# Patient Record
Sex: Male | Born: 1995 | Race: White | Hispanic: No | Marital: Single | Smoking: Current every day smoker
Health system: Southern US, Community
[De-identification: ages and names within clinical notes are randomized; demographics above are authoritative.]

---
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
## 2016-11-07 ENCOUNTER — Encounter (HOSPITAL_COMMUNITY): Payer: Self-pay | Admitting: Emergency Medicine

## 2016-11-07 ENCOUNTER — Emergency Department (HOSPITAL_COMMUNITY)
Admission: EM | Admit: 2016-11-07 | Discharge: 2016-11-07 | Disposition: A | Discharge status: Home Health | Payer: Self-pay | Attending: Emergency Medicine | Admitting: Emergency Medicine

## 2016-11-07 DIAGNOSIS — J039 Acute tonsillitis, unspecified: Secondary | ICD-10-CM | POA: Insufficient documentation

## 2016-11-07 DIAGNOSIS — F17210 Nicotine dependence, cigarettes, uncomplicated: Secondary | ICD-10-CM | POA: Insufficient documentation

## 2016-11-07 MED ORDER — AMOXICILLIN 500 MG PO CAPS: 500.0000 mg | ORAL_CAPSULE | Freq: Three times a day (TID) | ORAL | 0 refills | Status: DC

## 2016-11-07 NOTE — ED Triage Notes (Signed)
Pt c/o sore throat x 3 days

## 2016-11-07 NOTE — ED Provider Notes (Signed)
[facility]-EMERGENCY DEPT Provider Note   CSN: [id number] Arrival date & time: 11/07/16  [id number]     History   Chief Complaint Chief Complaint  Patient presents with  . Sore Throat    HPI Lee Lewis is a 21 y.o. male.  The history is provided by the patient.  Sore Throat  This is a new problem. The current episode started more than 2 days ago. The problem occurs constantly. The problem has been gradually worsening. Pertinent negatives include no headaches. Nothing aggravates the symptoms. Nothing relieves the symptoms. He has tried nothing for the symptoms.  Pt complains of swollen tonsils.    History reviewed. No pertinent past medical history.  There are no active problems to display for this patient.   History reviewed. No pertinent surgical history.     Home Medications    Prior to Admission medications   Medication Sig Start Date End Date Taking? Authorizing Provider  azithromycin (ZITHROMAX) 250 MG tablet Take all tablets at once. Take with food. 04/14/13   Acey LavWood, Allison L, MD    Family History No family history on file.  Social History Social History  Substance Use Topics  . Smoking status: Current Every Day Smoker    Types: Cigarettes  . Smokeless tobacco: Never Used  . Alcohol use Not on file     Allergies   Patient has no known allergies.   Review of Systems Review of Systems  Neurological: Negative for headaches.  All other systems reviewed and are negative.    Physical Exam Updated Vital Signs BP 131/76   Pulse 60   Temp 98.1 F (36.7 C)   Resp 18   Ht 5\' 5"  (1.651 m)   Wt 72.6 kg (160 lb)   SpO2 100%   BMI 26.63 kg/m   Physical Exam  Constitutional: He appears well-developed and well-nourished.  HENT:  Head: Normocephalic and atraumatic.  Right Ear: External ear normal.  Left Ear: External ear normal.  Nose: Nose normal.  Swollen bilat tonsils, erythema no exudate  Eyes: Conjunctivae are normal.  Neck: Neck supple.    Cardiovascular: Normal rate and regular rhythm.   No murmur heard. Pulmonary/Chest: Effort normal and breath sounds normal. No respiratory distress.  Abdominal: Soft. There is no tenderness.  Musculoskeletal: He exhibits no edema.  Neurological: He is alert.  Skin: Skin is warm and dry.  Psychiatric: He has a normal mood and affect.  Nursing note and vitals reviewed.    ED Treatments / Results  Labs (all labs ordered are listed, but only abnormal results are displayed) Labs Reviewed - No data to display  EKG  EKG Interpretation None       Radiology No results found.  Procedures Procedures (including critical care time)  Medications Ordered in ED Medications - No data to display   Initial Impression / Assessment and Plan / ED Course  I have reviewed the triage vital signs and the nursing notes.  Pertinent labs & imaging results that were available during my care of the patient were reviewed by me and considered in my medical decision making (see chart for details).     Meds ordered this encounter  Medications  . amoxicillin (AMOXIL) 500 MG capsule    Sig: Take 1 capsule (500 mg total) by mouth 3 (three) times daily.    Dispense:  30 capsule    Refill:  0    Order Specific Question:   Supervising Provider    Answer:   Eber HongMILLER, BRIAN [  [id number]]     Final Clinical Impressions(s) / ED Diagnoses   Final diagnoses:  Tonsillitis    New Prescriptions New Prescriptions   AMOXICILLIN (AMOXIL) 500 MG CAPSULE    Take 1 capsule (500 mg total) by mouth 3 (three) times daily.  An After Visit Summary was printed and given to the patient.    Elson Areas, [place] 11/07/16 0957    Lavera Guise, MD 11/07/16 [phone]

## 2016-11-07 NOTE — Discharge Instructions (Signed)
Return if any problems.

## 2017-02-02 ENCOUNTER — Emergency Department (HOSPITAL_COMMUNITY): Payer: Self-pay

## 2017-02-02 ENCOUNTER — Emergency Department (HOSPITAL_COMMUNITY)
Admission: EM | Admit: 2017-02-02 | Discharge: 2017-02-02 | Disposition: A | Discharge status: Home / Self Care | Payer: Self-pay | Attending: Emergency Medicine | Admitting: Emergency Medicine

## 2017-02-02 ENCOUNTER — Other Ambulatory Visit: Payer: Self-pay

## 2017-02-02 ENCOUNTER — Encounter (HOSPITAL_COMMUNITY): Payer: Self-pay | Admitting: Emergency Medicine

## 2017-02-02 DIAGNOSIS — R110 Nausea: Secondary | ICD-10-CM | POA: Insufficient documentation

## 2017-02-02 DIAGNOSIS — R10A2 Flank pain, left side: Secondary | ICD-10-CM

## 2017-02-02 DIAGNOSIS — F17210 Nicotine dependence, cigarettes, uncomplicated: Secondary | ICD-10-CM | POA: Insufficient documentation

## 2017-02-02 DIAGNOSIS — R109 Unspecified abdominal pain: Secondary | ICD-10-CM

## 2017-02-02 DIAGNOSIS — N3090 Cystitis, unspecified without hematuria: Secondary | ICD-10-CM

## 2017-02-02 DIAGNOSIS — R300 Dysuria: Secondary | ICD-10-CM | POA: Insufficient documentation

## 2017-02-02 LAB — BASIC METABOLIC PANEL
ANION GAP: 9 (ref 5–15)
BUN: 6 mg/dL (ref 6–20)
CALCIUM: 9.3 mg/dL (ref 8.9–10.3)
CO2: 27 mmol/L (ref 22–32)
Chloride: 100 mmol/L — ABNORMAL LOW (ref 101–111)
Creatinine, Ser: 1.19 mg/dL (ref 0.61–1.24)
Glucose, Bld: 89 mg/dL (ref 65–99)
Potassium: 3.6 mmol/L (ref 3.5–5.1)
Sodium: 136 mmol/L (ref 135–145)

## 2017-02-02 LAB — URINALYSIS, ROUTINE W REFLEX MICROSCOPIC
BACTERIA UA: NONE SEEN
BILIRUBIN URINE: NEGATIVE
Glucose, UA: NEGATIVE mg/dL
HGB URINE DIPSTICK: NEGATIVE
Ketones, ur: NEGATIVE mg/dL
NITRITE: NEGATIVE
PROTEIN: NEGATIVE mg/dL
Specific Gravity, Urine: 1.008 (ref 1.005–1.030)
pH: 6 (ref 5.0–8.0)

## 2017-02-02 LAB — CBC
HCT: 48.2 % (ref 39.0–52.0)
Hemoglobin: 15.9 g/dL (ref 13.0–17.0)
MCH: 30.9 pg (ref 26.0–34.0)
MCHC: 33 g/dL (ref 30.0–36.0)
MCV: 93.8 fL (ref 78.0–100.0)
PLATELETS: 204 10*3/uL (ref 150–400)
RBC: 5.14 MIL/uL (ref 4.22–5.81)
RDW: 12.4 % (ref 11.5–15.5)
WBC: 6.2 10*3/uL (ref 4.0–10.5)

## 2017-02-02 MED ORDER — CIPROFLOXACIN HCL 500 MG PO TABS: 500.0000 mg | ORAL_TABLET | Freq: Two times a day (BID) | ORAL | 0 refills | Status: DC

## 2017-02-02 MED ORDER — HYDROCODONE-ACETAMINOPHEN 5-325 MG PO TABS
1.0000 | ORAL_TABLET | Freq: Once | ORAL | Status: AC
  Administered 2017-02-02: 1 via ORAL
  Filled 2017-02-02: qty 1

## 2017-02-02 MED ORDER — ONDANSETRON 4 MG PO TBDP
4.0000 mg | ORAL_TABLET | Freq: Once | ORAL | Status: AC
  Administered 2017-02-02: 4 mg via ORAL
  Filled 2017-02-02: qty 1

## 2017-02-02 MED ORDER — NAPROXEN 500 MG PO TABS
500.0000 mg | ORAL_TABLET | Freq: Two times a day (BID) | ORAL | 0 refills | Status: DC
Start: 2017-02-02 — End: 2022-09-08

## 2017-02-02 NOTE — ED Provider Notes (Signed)
[facility]ENCY [facility] Provider Note   CSN: [id number] Arrival date & time: 02/02/17  [id number]     History   Chief Complaint Chief Complaint  Patient presents with  . Flank Pain    HPI Lee Lewis is a 21 y.o. male.  Patient with a complaint of left CVA and left flank pain associated with painful urination.  Also associated with nausea ongoing for 3 days.  Patient without past history of kidney stones.  Denies any blood in the urine or any discharge.  No fevers.      History reviewed. No pertinent past medical history.  There are no active problems to display for this patient.   History reviewed. No pertinent surgical history.     Home Medications    Prior to Admission medications   Medication Sig Start Date End Date Taking? Authorizing Provider  amoxicillin (AMOXIL) 500 MG capsule Take 1 capsule (500 mg total) by mouth 3 (three) times daily. 11/07/16   Elson AreasSofia, Leslie K, PA-C  azithromycin (ZITHROMAX) 250 MG tablet Take all tablets at once. Take with food. 04/14/13   Acey LavWood, Allison L, MD  ciprofloxacin (CIPRO) 500 MG tablet Take 1 tablet (500 mg total) by mouth 2 (two) times daily. 02/02/17   Vanetta MuldersZackowski, Naftali Carchi Fix, MD  naproxen (NAPROSYN) 500 MG tablet Take 1 tablet (500 mg total) by mouth 2 (two) times daily. 02/02/17   Vanetta MuldersZackowski, Melanee Cordial, MD    Family History History reviewed. No pertinent family history.  Social History Social History   Tobacco Use  . Smoking status: Current Every Day Smoker    Packs/day: 0.50    Types: Cigarettes  . Smokeless tobacco: Never Used  Substance Use Topics  . Alcohol use: Yes    Alcohol/week: 7.2 oz    Types: 12 Cans of beer per week    Comment: everyother day  . Drug use: Yes    Types: Marijuana    Comment: 2x monthly     Allergies   Patient has no known allergies.   Review of Systems Review of Systems  Constitutional: Negative for fever.  HENT: Negative for congestion.   Eyes: Negative for visual disturbance.    Respiratory: Negative for shortness of breath.   Cardiovascular: Negative for chest pain.  Gastrointestinal: Negative for abdominal pain.  Genitourinary: Positive for dysuria and flank pain. Negative for hematuria.  Musculoskeletal: Positive for back pain.  Skin: Negative for rash.  Neurological: Negative for headaches.  Hematological: Does not bruise/bleed easily.  Psychiatric/Behavioral: Negative for confusion.     Physical Exam Updated Vital Signs BP (!) 133/96   Pulse 76   Temp 98.2 F (36.8 C)   Resp 16   Ht 1.651 m (5\' 5" )   Wt 68 kg (150 lb)   SpO2 99%   BMI 24.96 kg/m   Physical Exam  Constitutional: He is oriented to person, place, and time. He appears well-developed and well-nourished. No distress.  HENT:  Head: Normocephalic and atraumatic.  Mouth/Throat: Oropharynx is clear and moist.  Eyes: Conjunctivae and EOM are normal.  Neck: Normal range of motion. Neck supple.  Cardiovascular: Normal rate, regular rhythm and normal heart sounds.  Pulmonary/Chest: Effort normal and breath sounds normal. No respiratory distress.  Abdominal: Soft. Bowel sounds are normal. There is no tenderness.  Musculoskeletal: Normal range of motion. He exhibits no edema or tenderness.  Neurological: He is alert and oriented to person, place, and time. No cranial nerve deficit or sensory deficit. He exhibits normal muscle tone. Coordination  normal.  Skin: No erythema.  Nursing note and vitals reviewed.    ED Treatments / Results  Labs (all labs ordered are listed, but only abnormal results are displayed) Labs Reviewed  URINALYSIS, ROUTINE W REFLEX MICROSCOPIC - Abnormal; Notable for the following components:      Result Value   Leukocytes, UA MODERATE (*)    Squamous Epithelial / LPF 0-5 (*)    All other components within normal limits  BASIC METABOLIC PANEL - Abnormal; Notable for the following components:   Chloride 100 (*)    All other components within normal limits   URINE CULTURE  CBC    EKG  EKG Interpretation None       Radiology Ct Renal Stone Study  Result Date: 02/02/2017 CLINICAL DATA:  Left flank pain and nausea for the past 3 days. EXAM: CT ABDOMEN AND PELVIS WITHOUT CONTRAST TECHNIQUE: Multidetector CT imaging of the abdomen and pelvis was performed following the standard protocol without IV contrast. COMPARISON:  None. FINDINGS: Lower chest: No acute abnormality. Hepatobiliary: No focal liver abnormality is seen. Focal fat along the falciform ligament. No gallstones, gallbladder wall thickening, or biliary dilatation. Pancreas: Unremarkable. No pancreatic ductal dilatation or surrounding inflammatory changes. Spleen: Normal in size without focal abnormality. Adrenals/Urinary Tract: The adrenal glands are unremarkable. The right kidney is located in the pelvis. No renal calculi or hydronephrosis. Mild circumferential bladder wall thickening. Stomach/Bowel: Stomach is within normal limits. Appendix appears normal. No evidence of bowel wall thickening, distention, or inflammatory changes. Vascular/Lymphatic: No significant vascular findings are present. No enlarged abdominal or pelvic lymph nodes. Reproductive: Prostate is unremarkable. Other: Small fat containing umbilical hernia. No free fluid or pneumoperitoneum. Musculoskeletal: No acute or significant osseous findings. IMPRESSION: 1. No renal or ureteral calculi.  No hydronephrosis. 2. 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 3. Mild circumferential bladder wall thickening, as can be seen with cystitis. Correlate with urinalysis. Electronically Signed   By: Obie Dredge M.D.   On: 02/02/2017 09:05    Procedures Procedures (including critical care time)  Medications Ordered in ED Medications  ondansetron (ZOFRAN-ODT) disintegrating tablet 4 mg (4 mg Oral Given 02/02/17 0810)  HYDROcodone-acetaminophen (NORCO/VICODIN) 5-325 MG per tablet 1 tablet (1 tablet Oral Given 02/02/17 0810)      Initial Impression / Assessment and Plan / ED Course  I have reviewed the triage vital signs and the nursing notes.  Pertinent labs & imaging results that were available during my care of the patient were reviewed by me and considered in my medical decision making (see chart for details).    Workup without evidence of any kidney stones.  There is some thickening of the bladder wall suggestive of perhaps cystitis.  Urinalysis not confirmatory for this.  No hematuria.  Urine culture pending.  But due to the inflammation and patient's symptoms and no evidence of stones will treat with Cipro for the next 7 days.  And treat also as if it could be musculoskeletal left flank pain with Naprosyn.  Patient will return for any new or worse symptoms.  Patient nontoxic no acute abdominal process either on exam or by CT.   Final Clinical Impressions(s) / ED Diagnoses   Final diagnoses:  Left flank pain  Cystitis    ED Discharge Orders        Ordered    naproxen (NAPROSYN) 500 MG tablet  2 times daily     02/02/17 0945    ciprofloxacin (CIPRO) 500 MG tablet  2 times  daily     02/02/17 0945       Vanetta MuldersZackowski, Almina Schul, MD 02/02/17 [phone]

## 2017-02-02 NOTE — ED Triage Notes (Signed)
PT c/o left flank pain with nausea x3 days with painful urination. PT also c/o generalized malaise.

## 2017-02-02 NOTE — ED Notes (Signed)
Pt aware of need for urine sample.  

## 2017-02-02 NOTE — Discharge Instructions (Signed)
Take antibiotic as directed.  Take the Naprosyn to help with pain.  Return for any new or worse symptoms.  Urine culture is pending.  If related to a bladder infection would expect improvement in 2 days.

## 2017-02-03 LAB — URINE CULTURE: Culture: NO GROWTH

## 2018-07-22 IMAGING — CT CT RENAL STONE PROTOCOL
2 of 4 series · 16 of 46 positions shown, 18 images · non-contrast
Comparison: None.

CLINICAL DATA: Left flank pain and nausea for the past 3 days.

EXAM:
CT ABDOMEN AND PELVIS WITHOUT CONTRAST
TECHNIQUE: Multidetector CT imaging of the abdomen and pelvis was performed
following the standard protocol without IV contrast.

[Series 2: axial st · axial · 0.63mm/px · z∈[-381,+19]mm · 13 of 88 slices shown, 15 images]
[im 4/88  soft-tissue]
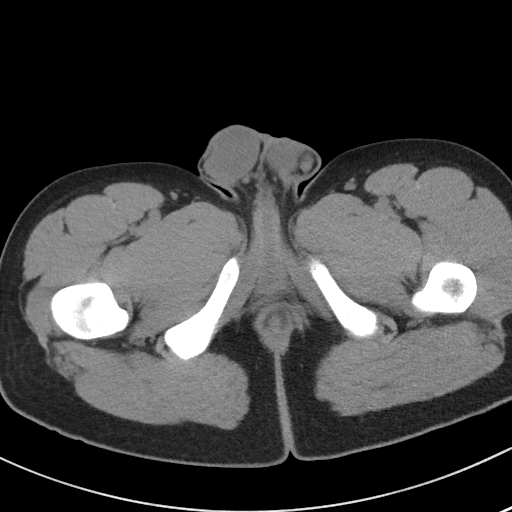
[im 4/88  bone]
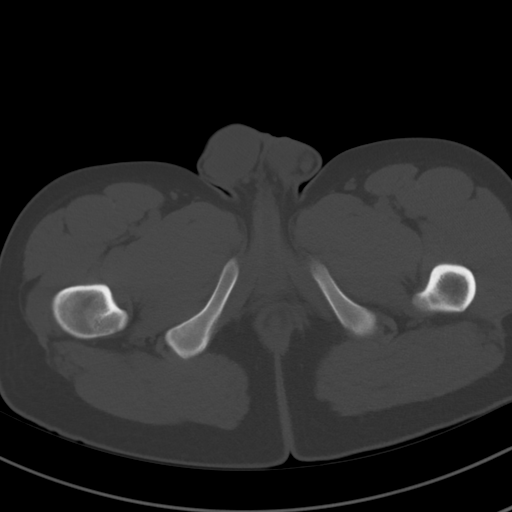
[im 11/88  soft-tissue]
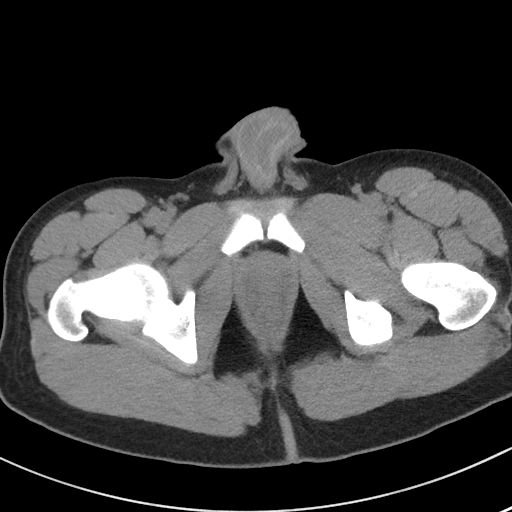
[im 18/88  soft-tissue]
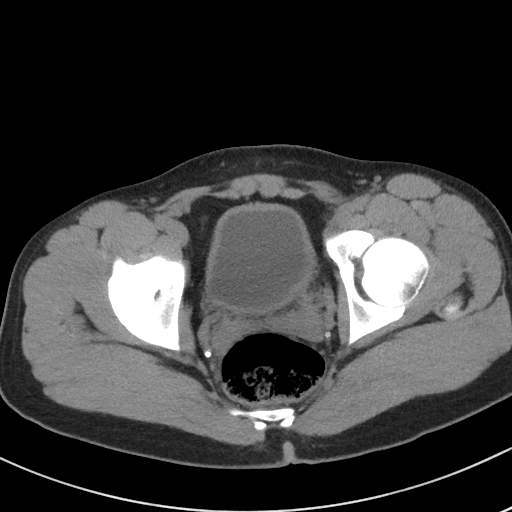
[im 25/88  soft-tissue]
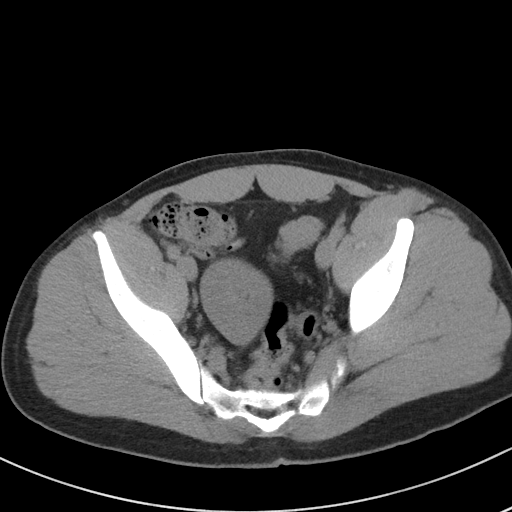
[im 32/88  soft-tissue]
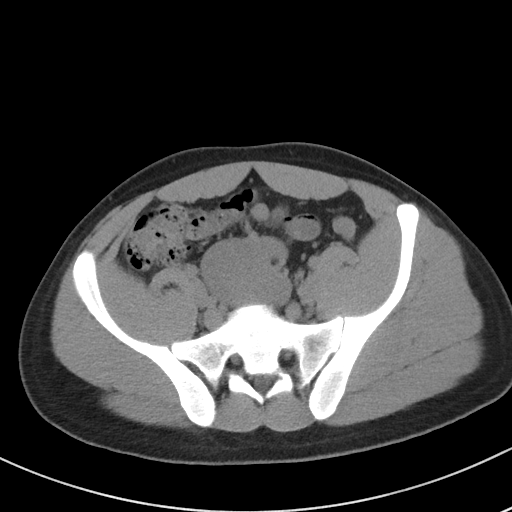
[im 39/88  soft-tissue]
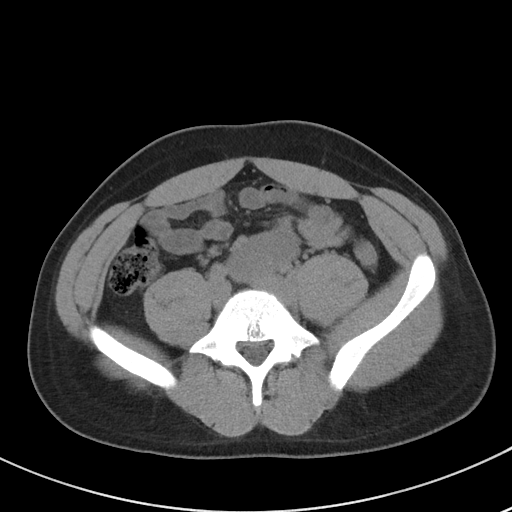
[im 46/88  soft-tissue]
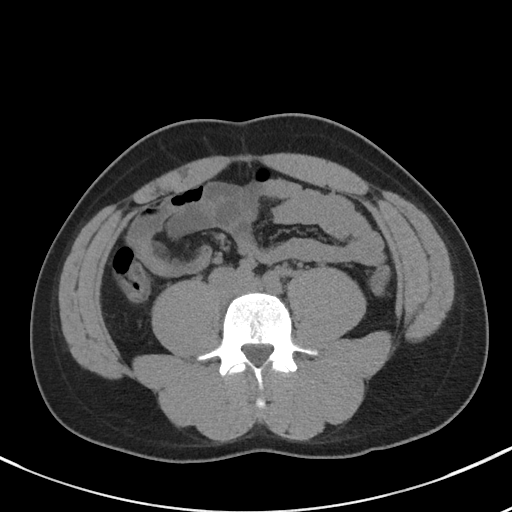
[im 49/88  soft-tissue]
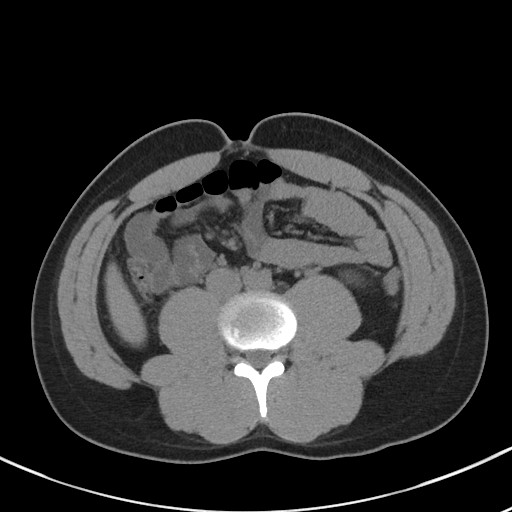
[im 56/88  soft-tissue]
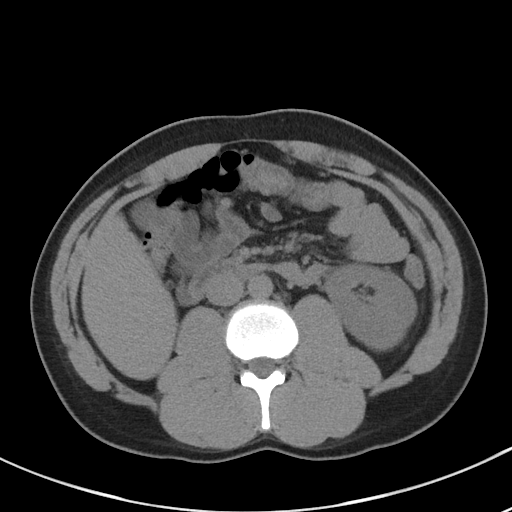
[im 56/88  bone]
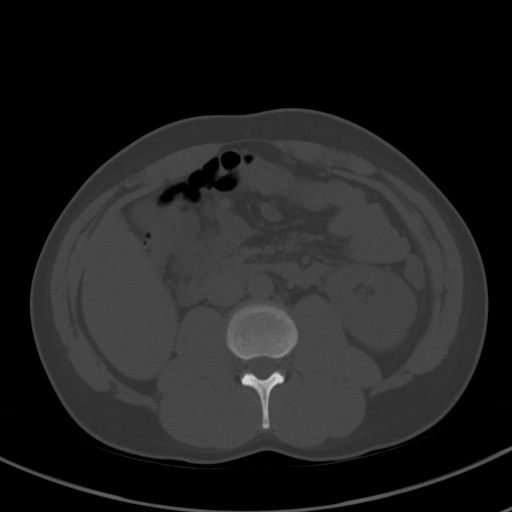
[im 63/88  soft-tissue]
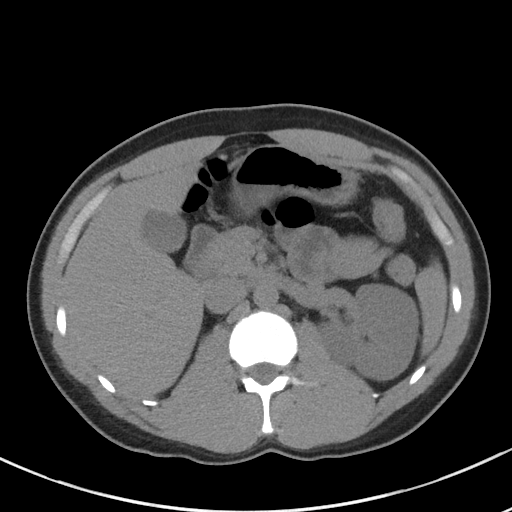
[im 70/88  soft-tissue]
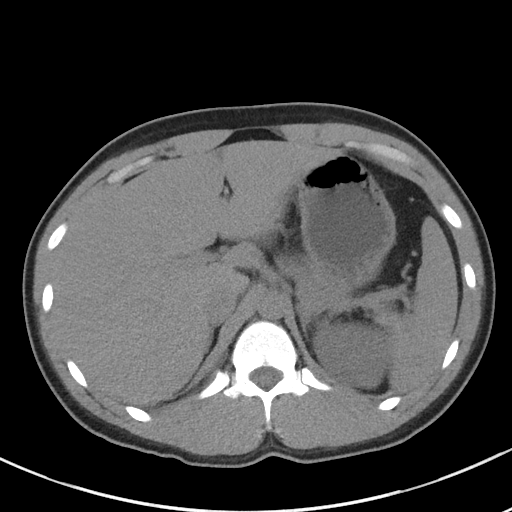
[im 77/88  soft-tissue]
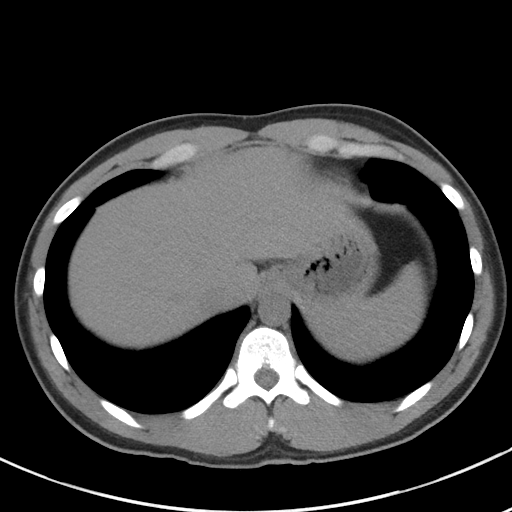
[im 84/88  soft-tissue]
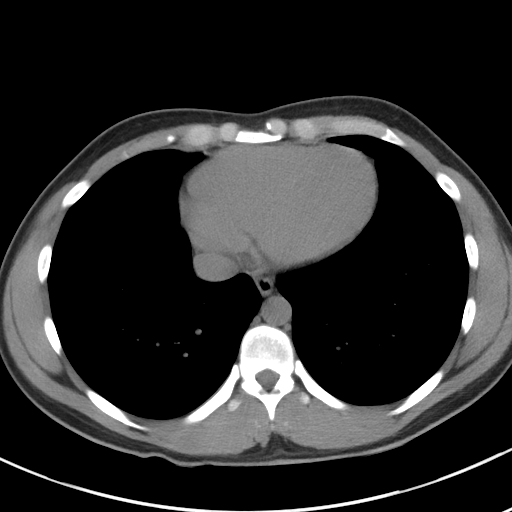

[Series 5: coronal st · coronal · 0.70mm/px · 3 of 85 slices shown]
[im 29/85  soft-tissue]
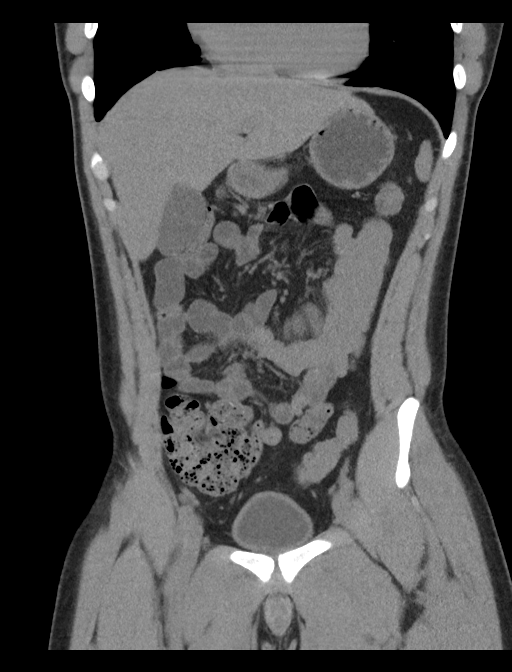
[im 38/85  soft-tissue]
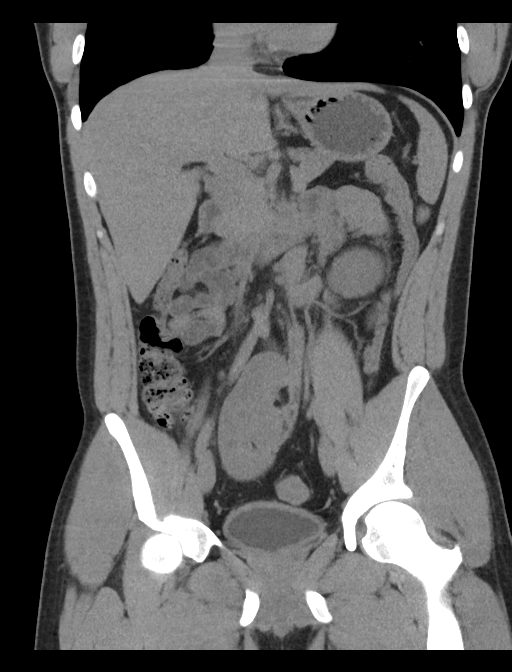
[im 47/85  soft-tissue]
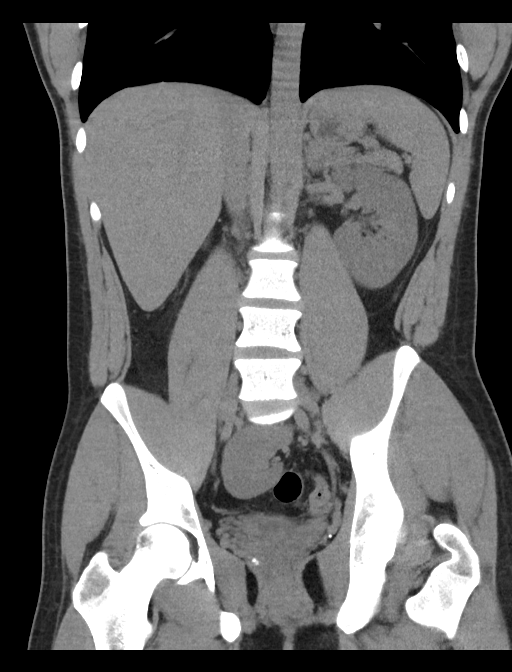

[16 of 46 positions shown; findings below may reference images not displayed]

FINDINGS: Lower chest: No acute abnormality.

Hepatobiliary: No focal liver abnormality is seen. Focal fat along
the falciform ligament. No gallstones, gallbladder wall thickening,
or biliary dilatation.

Pancreas: Unremarkable. No pancreatic ductal dilatation or
surrounding inflammatory changes.

Spleen: Normal in size without focal abnormality.

Adrenals/Urinary Tract: The adrenal glands are unremarkable. The
right kidney is located in the pelvis. No renal calculi or
hydronephrosis. Mild circumferential bladder wall thickening.

Stomach/Bowel: Stomach is within normal limits. Appendix appears
normal. No evidence of bowel wall thickening, distention, or
inflammatory changes.

Vascular/Lymphatic: No significant vascular findings are present. No
enlarged abdominal or pelvic lymph nodes.

Reproductive: Prostate is unremarkable.

Other: Small fat containing umbilical hernia. No free fluid or
pneumoperitoneum.

Musculoskeletal: No acute or significant osseous findings.
IMPRESSION: 1. No renal or ureteral calculi.  No hydronephrosis.
2. Ectopic right kidney located in the pelvis.
3. Mild circumferential bladder wall thickening, as can be seen with
cystitis. Correlate with urinalysis.

## 2022-04-21 ENCOUNTER — Emergency Department (HOSPITAL_BASED_OUTPATIENT_CLINIC_OR_DEPARTMENT_OTHER)
Admission: EM | Admit: 2022-04-21 | Discharge: 2022-04-21 | Disposition: A | Discharge status: Home / Self Care | Payer: Self-pay | Attending: Emergency Medicine | Admitting: Emergency Medicine

## 2022-04-21 ENCOUNTER — Emergency Department (HOSPITAL_BASED_OUTPATIENT_CLINIC_OR_DEPARTMENT_OTHER): Payer: Self-pay

## 2022-04-21 ENCOUNTER — Encounter (HOSPITAL_BASED_OUTPATIENT_CLINIC_OR_DEPARTMENT_OTHER): Payer: Self-pay

## 2022-04-21 ENCOUNTER — Telehealth (HOSPITAL_BASED_OUTPATIENT_CLINIC_OR_DEPARTMENT_OTHER): Payer: Self-pay | Admitting: Emergency Medicine

## 2022-04-21 DIAGNOSIS — N3001 Acute cystitis with hematuria: Secondary | ICD-10-CM

## 2022-04-21 DIAGNOSIS — F17210 Nicotine dependence, cigarettes, uncomplicated: Secondary | ICD-10-CM | POA: Insufficient documentation

## 2022-04-21 DIAGNOSIS — N50812 Left testicular pain: Secondary | ICD-10-CM | POA: Insufficient documentation

## 2022-04-21 DIAGNOSIS — N3000 Acute cystitis without hematuria: Secondary | ICD-10-CM | POA: Insufficient documentation

## 2022-04-21 LAB — URINALYSIS, ROUTINE W REFLEX MICROSCOPIC
Glucose, UA: NEGATIVE mg/dL
Ketones, ur: 40 mg/dL — AB
Nitrite: NEGATIVE
Protein, ur: 100 mg/dL — AB
Specific Gravity, Urine: 1.02 (ref 1.005–1.030)
pH: 6 (ref 5.0–8.0)

## 2022-04-21 LAB — URINALYSIS, MICROSCOPIC (REFLEX)

## 2022-04-21 MED ORDER — CEFDINIR 300 MG PO CAPS: 300.0000 mg | ORAL_CAPSULE | Freq: Two times a day (BID) | ORAL | 0 refills | Status: AC

## 2022-04-21 NOTE — ED Notes (Signed)
Notified lab of add on GC Chlamydia

## 2022-04-21 NOTE — ED Notes (Signed)
Urine specimen in lab

## 2022-04-21 NOTE — ED Notes (Signed)
D/c paperwork reviewed with pt, including prescriptions and follow up care.  No questions or concerns voiced at time of d/c. Lee Lewis Pt verbalized understanding, Ambulatory without assistance to ED exit, NAD.

## 2022-04-21 NOTE — ED Triage Notes (Signed)
C/o dull pains in left testicle since yesterday, starting having hematuria yesterday.

## 2022-04-21 NOTE — Discharge Instructions (Addendum)
We evaluated you for your testicular pain.  Your ultrasound did not show any dangerous problem.  Your urinary testing showed signs of a urinary infection.  We have also sent testing for gonorrhea and chlamydia.  Please keep an eye on your MyChart profile.  If this comes back positive, we will hopefully also call you.  You will need to come back to the emergency department or go to urgent care for treatment.  Please return to the emergency department if you develop any fevers or chills, flank pain, worsening symptoms, nausea or vomiting, or any other concerning symptoms.

## 2022-04-21 NOTE — ED Notes (Signed)
Patient transported to Ultrasound 

## 2022-04-22 LAB — GC/CHLAMYDIA PROBE AMP (~~LOC~~) NOT AT ARMC
Chlamydia: NEGATIVE
Comment: NEGATIVE
Comment: NORMAL
Neisseria Gonorrhea: NEGATIVE

## 2022-04-22 LAB — URINE CULTURE

## 2022-04-22 NOTE — ED Provider Notes (Signed)
EMERGENCY DEPARTMENT AT [facility] Provider Note  CSN: [id number] Arrival date & time: 04/21/22 1056  Chief Complaint(s) Hematuria and Testicle Pain  HPI Lee Lewis is a 27 y.o. male without significant past medical history presenting to the emergency department with left testicle pain, hematuria.  Reports symptoms have been present since yesterday.  He denies dysuria or urethral discharge.  He reports that he had this once before in his left testicle swelled up and it went away on its own. No fevers or chills.  Reports he has been monogamous with his fianc with no new sexual partners.  No back or flank pain.  No abdominal pain.  Symptoms mild.  No trauma to the testicle    Past Medical History History reviewed. No pertinent past medical history. There are no problems to display for this patient.  Home Medication(s) Prior to Admission medications   Medication Sig Start Date End Date Taking? Authorizing Provider  cefdinir (OMNICEF) 300 MG capsule Take 1 capsule (300 mg total) by mouth 2 (two) times daily for 7 days. 04/21/22 04/28/22 Yes Cristie Hem, MD  naproxen (NAPROSYN) 500 MG tablet Take 1 tablet (500 mg total) by mouth 2 (two) times daily. 02/02/17   Fredia Sorrow, MD                                                                                                                                    Past Surgical History History reviewed. No pertinent surgical history. Family History History reviewed. No pertinent family history.  Social History Social History   Tobacco Use   Smoking status: Every Day    Packs/day: 0.50    Types: Cigarettes   Smokeless tobacco: Never  Vaping Use   Vaping Use: Some days  Substance Use Topics   Alcohol use: Not Currently    Alcohol/week: 12.0 standard drinks of alcohol    Types: 12 Cans of beer per week   Drug use: Yes    Types: Marijuana    Comment: 2x monthly   Allergies Patient has no known  allergies.  Review of Systems Review of Systems  All other systems reviewed and are negative.   Physical Exam Vital Signs  I have reviewed the triage vital signs BP 120/64   Pulse 98   Temp 98.3 F (36.8 C)   Resp 15   Ht 5' 5"$  (1.651 m)   Wt 71.7 kg   SpO2 99%   BMI 26.29 kg/m  Physical Exam Vitals and nursing note reviewed.  Constitutional:      General: He is not in acute distress.    Appearance: Normal appearance.  HENT:     Mouth/Throat:     Mouth: Mucous membranes are moist.  Eyes:     Conjunctiva/sclera: Conjunctivae normal.  Cardiovascular:     Rate and Rhythm: Normal rate and regular rhythm.  Pulmonary:     Effort: Pulmonary  effort is normal. No respiratory distress.     Breath sounds: Normal breath sounds.  Abdominal:     General: Abdomen is flat.     Palpations: Abdomen is soft.     Tenderness: There is no abdominal tenderness.  Genitourinary:    Comments: Chaperoned by RN, normal external male GU exam, mild left testicle ttp Musculoskeletal:     Right lower leg: No edema.     Left lower leg: No edema.  Skin:    General: Skin is warm and dry.     Capillary Refill: Capillary refill takes less than 2 seconds.  Neurological:     Mental Status: He is alert and oriented to person, place, and time. Mental status is at baseline.  Psychiatric:        Mood and Affect: Mood normal.        Behavior: Behavior normal.     ED Results and Treatments Labs (all labs ordered are listed, but only abnormal results are displayed) Labs Reviewed  URINALYSIS, ROUTINE W REFLEX MICROSCOPIC - Abnormal; Notable for the following components:      Result Value   APPearance HAZY (*)    Hgb urine dipstick MODERATE (*)    Bilirubin Urine SMALL (*)    Ketones, ur 40 (*)    Protein, ur 100 (*)    Leukocytes,Ua SMALL (*)    All other components within normal limits  URINALYSIS, MICROSCOPIC (REFLEX) - Abnormal; Notable for the following components:   Bacteria, UA FEW (*)     Non Squamous Epithelial PRESENT (*)    All other components within normal limits  URINE CULTURE  GC/CHLAMYDIA PROBE AMP ([place]) NOT AT [facility]                                                                                                                          Radiology US SCROTUM W/DOPPLER  Result Date: 04/21/2022 CLINICAL DATA:  Testicular pain on the left EXAM: SCROTAL ULTRASOUND DOPPLER ULTRASOUND OF THE TESTICLES TECHNIQUE: Complete ultrasound examination of the testicles, epididymis, and other scrotal structures was performed. Color and spectral Doppler ultrasound were also utilized to evaluate blood flow to the testicles. COMPARISON:  None Available. FINDINGS: Right testicle Measurements: 5.1 x 2.0 x 2.9. No mass or microlithiasis visualized. Left testicle Measurements: 4.6 x 1.9 x 2.9. No mass or microlithiasis visualized. Right epididymis:  Normal in size and appearance. Left epididymis:  Normal in size and appearance. Hydrocele:  None visualized. Varicocele:  None visualized. Pulsed Doppler interrogation of both testes demonstrates normal low resistance arterial and venous waveforms bilaterally. IMPRESSION: Unremarkable testicular ultrasound. Preserved echotexture and blood flow on Doppler Electronically Signed   By: Jill Side M.D.   On: 04/21/2022 12:54    Pertinent labs & imaging results that were available during my care of the patient were reviewed by me and considered in my medical decision making (see MDM for details).  Medications Ordered in ED Medications - No data to  display                                                                                                                                   Procedures Procedures  (including critical care time)  Medical Decision Making / ED Course   MDM:  27 year old male presenting to the emergency department with left testicle pain and hematuria.  Patient well-appearing, testicular exam with minimal tenderness,  no apparent abnormality.  Ultrasound of the scrotum performed without evidence of abscess, epididymitis, torsion.  Urinalysis with some WBCs and bacteria, few squamous cells, will treat for possible UTI, discussed possibility of gonorrhea and chlamydia with the patient, he reports that he has been monogamous and has had no new sexual partners in some time, sent gonorrhea chlamydia testing, advised patient that he may need to return if this comes back positive.  Patient understands and knows that if he is positive he needs to let his fiance know as well.      Additional history obtained: -Additional history obtained from spouse -External records from outside source obtained and reviewed including: Chart review including previous notes, labs, imaging, consultation notes including ED visit 02/02/22   Lab Tests: -I ordered, reviewed, and interpreted labs.   The pertinent results include:   Labs Reviewed  URINALYSIS, ROUTINE W REFLEX MICROSCOPIC - Abnormal; Notable for the following components:      Result Value   APPearance HAZY (*)    Hgb urine dipstick MODERATE (*)    Bilirubin Urine SMALL (*)    Ketones, ur 40 (*)    Protein, ur 100 (*)    Leukocytes,Ua SMALL (*)    All other components within normal limits  URINALYSIS, MICROSCOPIC (REFLEX) - Abnormal; Notable for the following components:   Bacteria, UA FEW (*)    Non Squamous Epithelial PRESENT (*)    All other components within normal limits  URINE CULTURE  GC/CHLAMYDIA PROBE AMP ([place]) NOT AT [facility]    Notable for bacturia, mild WBC in urine  Imaging Studies ordered: I ordered imaging studies including US scrotum On my interpretation imaging demonstrates no acute process I independently visualized and interpreted imaging. I agree with the radiologist interpretation   Medicines ordered and prescription drug management: Meds ordered this encounter  Medications   cefdinir (OMNICEF) 300 MG capsule    Sig: Take 1  capsule (300 mg total) by mouth 2 (two) times daily for 7 days.    Dispense:  14 capsule    Refill:  0    -I have reviewed the patients home medicines and have made adjustments as needed  Social Determinants of Health:  Diagnosis or treatment significantly limited by social determinants of health: current smoker   Co morbidities that complicate the patient evaluation History reviewed. No pertinent past medical history.    Dispostion: Disposition decision including need for hospitalization was considered, and patient discharged from emergency department.    Final  Clinical Impression(s) / ED Diagnoses Final diagnoses:  Acute cystitis with hematuria     This chart was dictated using voice recognition software.  Despite best efforts to proofread,  errors can occur which can change the documentation meaning.    Cristie Hem, MD 04/22/22 1000

## 2022-04-23 LAB — URINE CULTURE: Culture: 40000 — AB

## 2022-04-24 ENCOUNTER — Telehealth (HOSPITAL_BASED_OUTPATIENT_CLINIC_OR_DEPARTMENT_OTHER): Payer: Self-pay | Admitting: *Deleted

## 2022-04-24 NOTE — Telephone Encounter (Signed)
Post ED Visit - Positive Culture Follow-up  Culture report reviewed by antimicrobial stewardship pharmacist: [place] Team []$  Elenor Quinones, Pharm.D. []$  Heide Guile, Pharm.D., BCPS AQ-ID []$  Parks Neptune, Pharm.D., BCPS []$  Alycia Rossetti, Pharm.D., BCPS []$  [place], Pharm.D., BCPS, AAHIVP []$  Legrand Como, Pharm.D., BCPS, AAHIVP []$  Salome Arnt, PharmD, BCPS []$  Johnnette Gourd, PharmD, BCPS []$  Hughes Better, PharmD, BCPS []$  Leeroy Cha, PharmD []$  Laqueta Linden, PharmD, BCPS []$  Albertina Parr, PharmD  [place] Team []$  Leodis Sias, PharmD []$  Lindell Spar, PharmD []$  Royetta Asal, PharmD []$  Graylin Shiver, Rph []$  Rema Fendt) Glennon Mac, PharmD []$  Arlyn Dunning, PharmD []$  Netta Cedars, PharmD []$  Dia Sitter, PharmD []$  Leone Haven, PharmD []$  Gretta Arab, PharmD []$  Theodis Shove, PharmD []$  Peggyann Juba, PharmD []$  Reuel Boom, PharmD   Positive urine culture Treated with Cefdinir, organism sensitive to the same and no further patient follow-up is required at this time.  Arturo Morton, PharmD Harlon Flor Talley 04/24/2022, 10:54 AM

## 2022-09-08 ENCOUNTER — Other Ambulatory Visit: Payer: Self-pay

## 2022-09-08 ENCOUNTER — Encounter (HOSPITAL_COMMUNITY): Payer: Self-pay

## 2022-09-08 ENCOUNTER — Emergency Department (HOSPITAL_COMMUNITY)
Admission: EM | Admit: 2022-09-08 | Discharge: 2022-09-08 | Disposition: A | Discharge status: Home / Self Care | Payer: Medicaid Other | Attending: Emergency Medicine | Admitting: Emergency Medicine

## 2022-09-08 ENCOUNTER — Emergency Department (HOSPITAL_COMMUNITY): Payer: Medicaid Other

## 2022-09-08 DIAGNOSIS — S8391XA Sprain of unspecified site of right knee, initial encounter: Secondary | ICD-10-CM | POA: Diagnosis not present

## 2022-09-08 DIAGNOSIS — S8991XA Unspecified injury of right lower leg, initial encounter: Secondary | ICD-10-CM | POA: Diagnosis present

## 2022-09-08 DIAGNOSIS — S80212A Abrasion, left knee, initial encounter: Secondary | ICD-10-CM | POA: Diagnosis not present

## 2022-09-08 DIAGNOSIS — Y92410 Unspecified street and highway as the place of occurrence of the external cause: Secondary | ICD-10-CM | POA: Insufficient documentation
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
## 2022-09-08 NOTE — Progress Notes (Signed)
Orthopedic Tech Progress Note Patient Details:  Lee Lewis 15-Dec-1995 [id number]  Ortho Devices Type of Ortho Device: Knee Immobilizer Ortho Device/Splint Location: RLE Ortho Device/Splint Interventions: Ordered, Application, Adjustment   Post Interventions Patient Tolerated: Well Instructions Provided: Care of device  Donald Pore 09/08/2022, 10:32 AM
